# Patient Record
Sex: Female | Born: 1970 | Race: White | Hispanic: No | Marital: Married
Health system: Southern US, Community
[De-identification: ages and names within clinical notes are randomized; demographics above are authoritative.]

## PROBLEM LIST (undated history)

## (undated) DIAGNOSIS — K5010 Crohn's disease of large intestine without complications: Secondary | ICD-10-CM

## (undated) HISTORY — DX: Crohn's disease of large intestine without complications: K50.10

---

## 1999-07-03 ENCOUNTER — Other Ambulatory Visit: Admission: RE | Admit: 1999-07-03 | Discharge: 1999-07-03 | Payer: Self-pay | Admitting: Gynecology

## 2000-08-02 ENCOUNTER — Other Ambulatory Visit: Admission: RE | Admit: 2000-08-02 | Discharge: 2000-08-02 | Payer: Self-pay | Admitting: Gynecology

## 2001-06-22 ENCOUNTER — Encounter: Payer: Self-pay | Admitting: Emergency Medicine

## 2001-06-22 ENCOUNTER — Inpatient Hospital Stay (HOSPITAL_COMMUNITY): Admission: EM | Admit: 2001-06-22 | Discharge: 2001-06-24 | Payer: Self-pay | Admitting: Emergency Medicine

## 2001-06-23 ENCOUNTER — Encounter: Payer: Self-pay | Admitting: Geriatric Medicine

## 2001-08-02 ENCOUNTER — Other Ambulatory Visit: Admission: RE | Admit: 2001-08-02 | Discharge: 2001-08-02 | Payer: Self-pay | Admitting: Gynecology

## 2001-08-11 ENCOUNTER — Encounter (INDEPENDENT_AMBULATORY_CARE_PROVIDER_SITE_OTHER): Payer: Self-pay | Admitting: Specialist

## 2001-08-11 ENCOUNTER — Ambulatory Visit (HOSPITAL_COMMUNITY): Admission: RE | Admit: 2001-08-11 | Discharge: 2001-08-11 | Payer: Self-pay | Admitting: Gastroenterology

## 2001-09-21 ENCOUNTER — Encounter (INDEPENDENT_AMBULATORY_CARE_PROVIDER_SITE_OTHER): Payer: Self-pay | Admitting: Specialist

## 2001-09-21 ENCOUNTER — Inpatient Hospital Stay (HOSPITAL_COMMUNITY): Admission: RE | Admit: 2001-09-21 | Discharge: 2001-09-22 | Payer: Self-pay | Admitting: Gynecology

## 2002-09-14 ENCOUNTER — Other Ambulatory Visit: Admission: RE | Admit: 2002-09-14 | Discharge: 2002-09-14 | Payer: Self-pay | Admitting: Gynecology

## 2003-11-17 ENCOUNTER — Emergency Department (HOSPITAL_COMMUNITY): Admission: EM | Admit: 2003-11-17 | Discharge: 2003-11-17 | Payer: Self-pay | Admitting: Emergency Medicine

## 2003-11-20 ENCOUNTER — Other Ambulatory Visit: Admission: RE | Admit: 2003-11-20 | Discharge: 2003-11-20 | Payer: Self-pay | Admitting: Gynecology

## 2003-11-22 ENCOUNTER — Ambulatory Visit (HOSPITAL_COMMUNITY): Admission: RE | Admit: 2003-11-22 | Discharge: 2003-11-23 | Payer: Self-pay | Admitting: General Surgery

## 2003-11-22 ENCOUNTER — Encounter (INDEPENDENT_AMBULATORY_CARE_PROVIDER_SITE_OTHER): Payer: Self-pay | Admitting: Specialist

## 2004-11-20 ENCOUNTER — Other Ambulatory Visit: Admission: RE | Admit: 2004-11-20 | Discharge: 2004-11-20 | Payer: Self-pay | Admitting: Gynecology

## 2006-01-13 ENCOUNTER — Other Ambulatory Visit: Admission: RE | Admit: 2006-01-13 | Discharge: 2006-01-13 | Payer: Self-pay | Admitting: Gynecology

## 2007-05-05 ENCOUNTER — Other Ambulatory Visit: Admission: RE | Admit: 2007-05-05 | Discharge: 2007-05-05 | Payer: Self-pay | Admitting: Gynecology

## 2007-05-13 ENCOUNTER — Encounter: Admission: RE | Admit: 2007-05-13 | Discharge: 2007-05-13 | Payer: Self-pay | Admitting: Gynecology

## 2008-05-25 ENCOUNTER — Emergency Department (HOSPITAL_COMMUNITY): Admission: EM | Admit: 2008-05-25 | Discharge: 2008-05-25 | Payer: Self-pay | Admitting: Emergency Medicine

## 2008-06-07 ENCOUNTER — Other Ambulatory Visit: Admission: RE | Admit: 2008-06-07 | Discharge: 2008-06-07 | Payer: Self-pay | Admitting: Gynecology

## 2008-07-27 ENCOUNTER — Inpatient Hospital Stay (HOSPITAL_COMMUNITY): Admission: EM | Admit: 2008-07-27 | Discharge: 2008-07-31 | Payer: Self-pay | Admitting: Emergency Medicine

## 2009-07-26 ENCOUNTER — Encounter: Admission: RE | Admit: 2009-07-26 | Discharge: 2009-07-26 | Payer: Self-pay | Admitting: Gynecology

## 2009-09-12 ENCOUNTER — Emergency Department (HOSPITAL_COMMUNITY): Admission: EM | Admit: 2009-09-12 | Discharge: 2009-09-12 | Payer: Self-pay | Admitting: Emergency Medicine

## 2009-09-28 IMAGING — CT CT ABDOMEN W/ CM
2 of 5 series · 16 of 46 positions shown, 18 images · IV contrast (agent unspecified)
Comparison: CT 05/13/2007

CT ABDOMEN

CLINICAL DATA: Epigastric pain, Crohn disease, prior ileal cecal
resection.

CT ABDOMEN AND PELVIS WITH CONTRAST
TECHNIQUE: Multidetector CT imaging of the abdomen and pelvis was
performed using the standard protocol following bolus
administration of intravenous contrast.
Contrast: 100 ml  Omni 300

[Series 2: abd_pel 5.0 b40f st · axial · 0.58mm/px · z∈[-590,-210]mm · 13 of 86 slices shown, 15 images]
[im 5/86  soft-tissue]
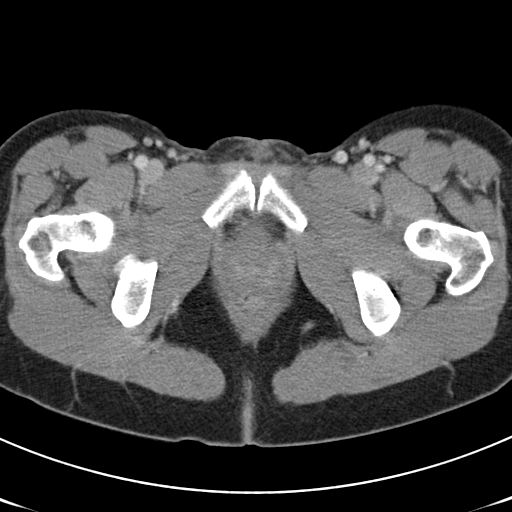
[im 5/86  bone]
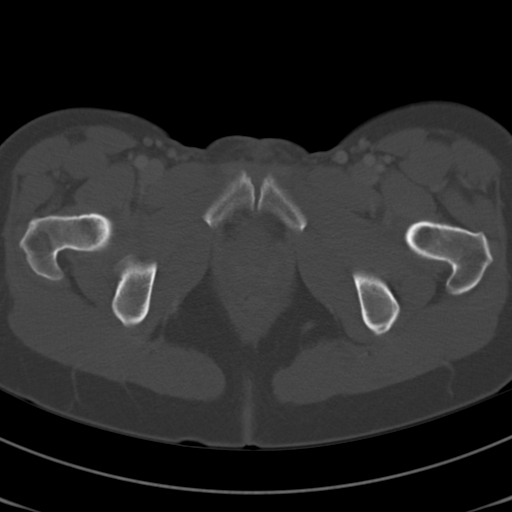
[im 14/86  soft-tissue]
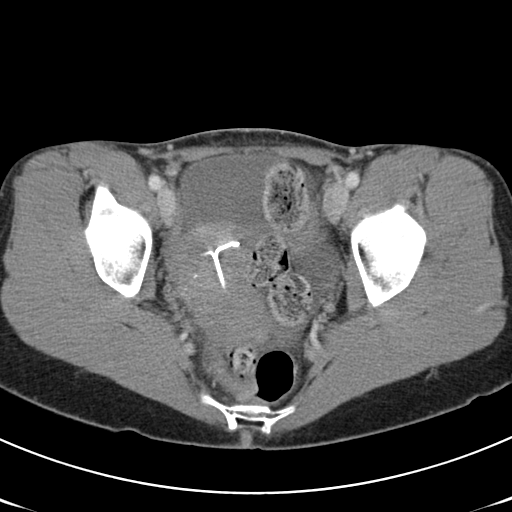
[im 18/86  soft-tissue]
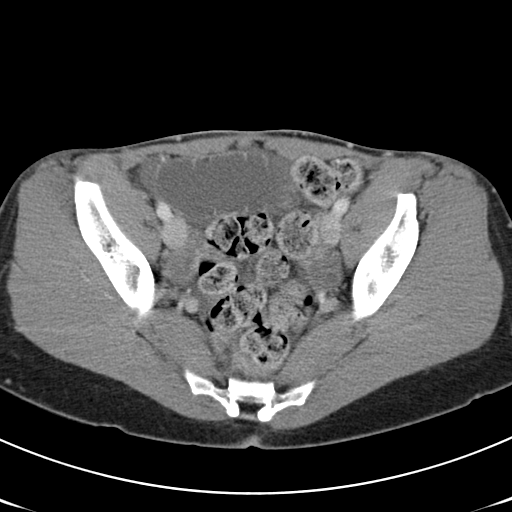
[im 23/86  soft-tissue]
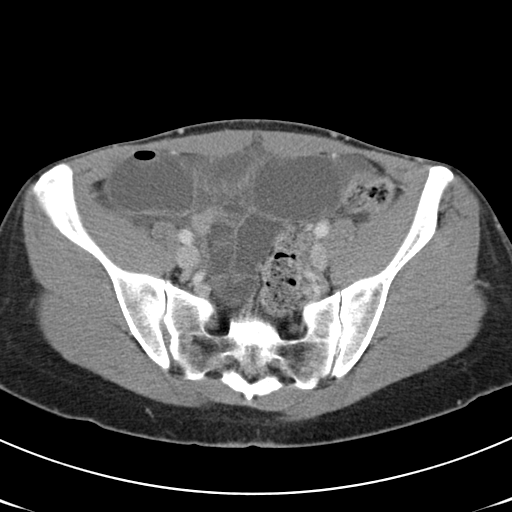
[im 32/86  soft-tissue]
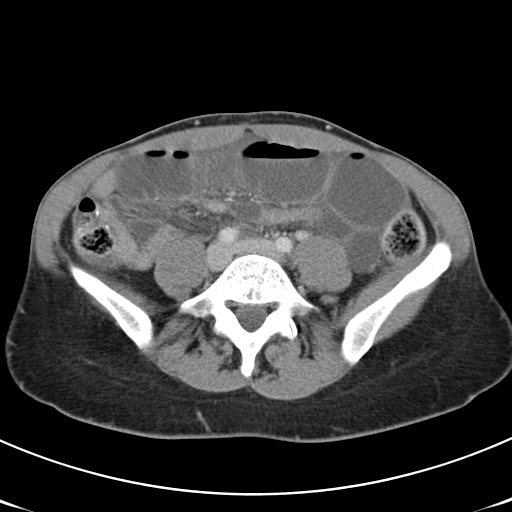
[im 36/86  soft-tissue]
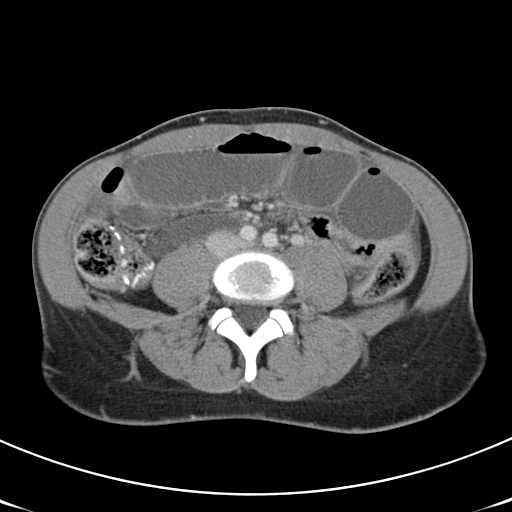
[im 45/86  soft-tissue]
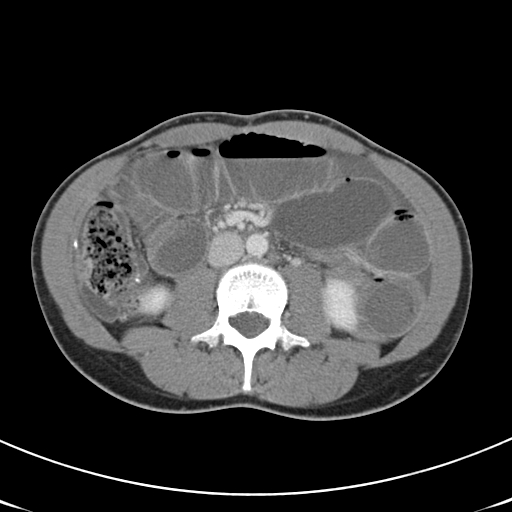
[im 50/86  soft-tissue]
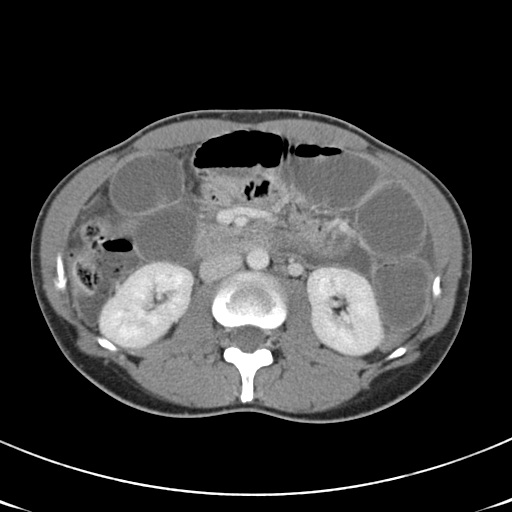
[im 54/86  soft-tissue]
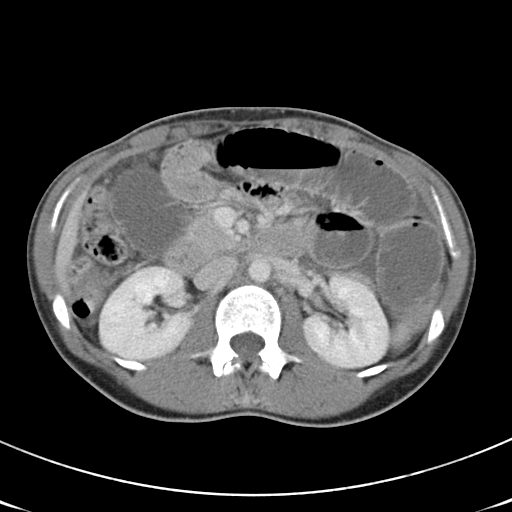
[im 54/86  bone]
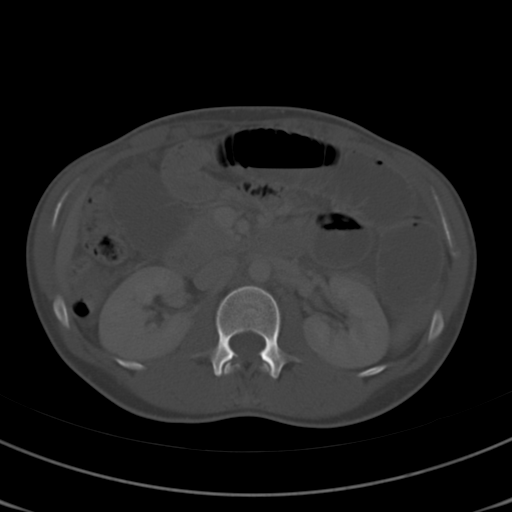
[im 63/86  soft-tissue]
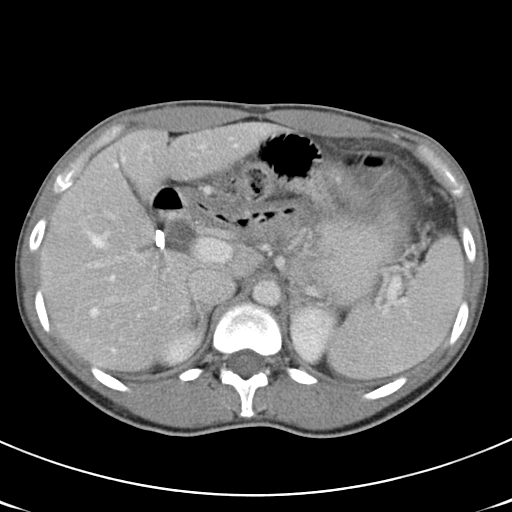
[im 68/86  soft-tissue]
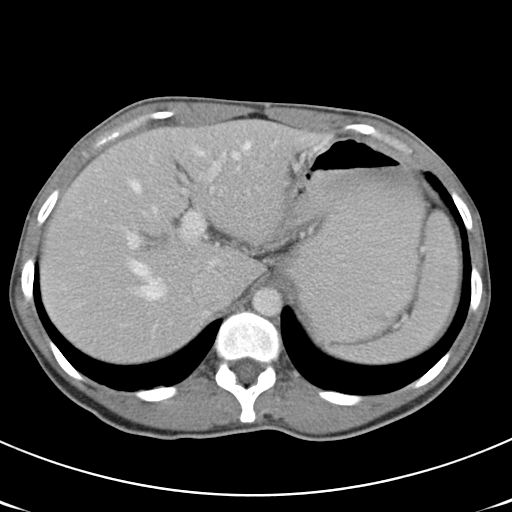
[im 72/86  soft-tissue]
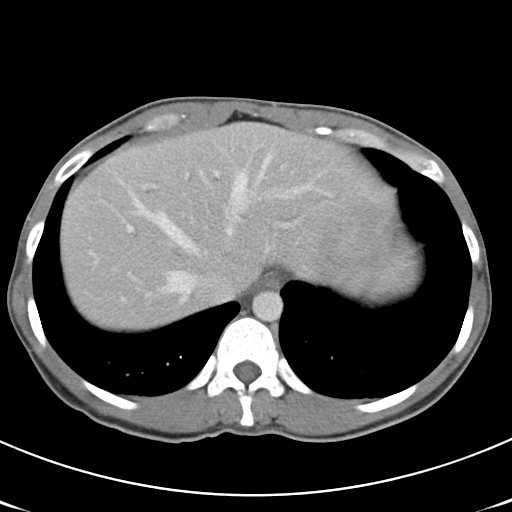
[im 81/86  soft-tissue]
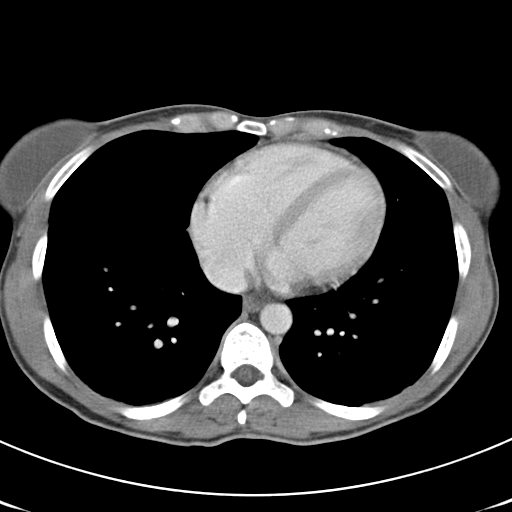

[Series 602: <mpr thick range> · coronal · 0.87mm/px · 3 of 61 slices shown]
[im 21/61  soft-tissue]
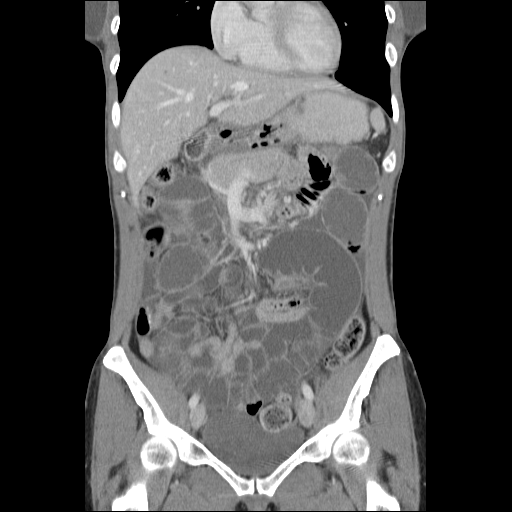
[im 27/61  soft-tissue]
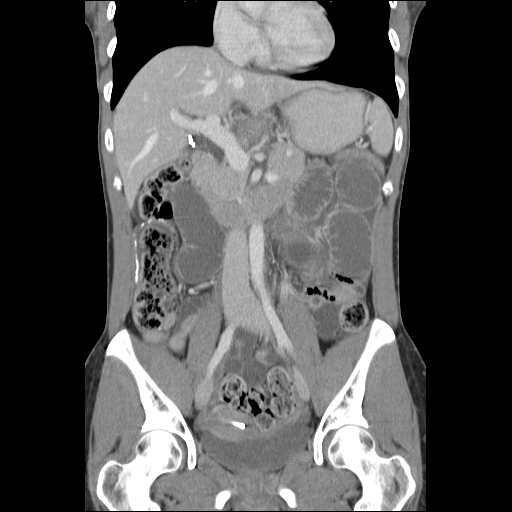
[im 34/61  soft-tissue]
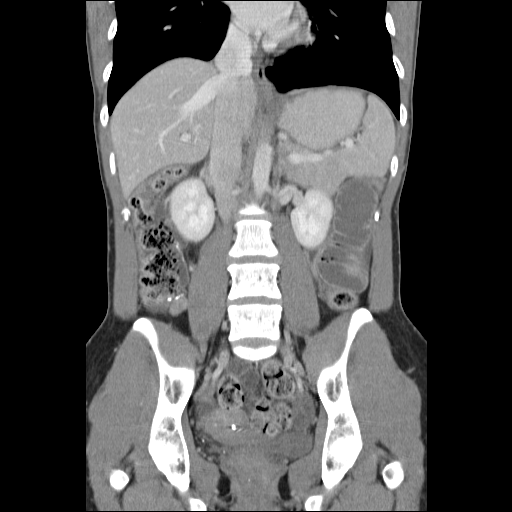

[16 of 46 positions shown; findings below may reference images not displayed]

FINDINGS: Bilateral breast implants.  Lung bases are clear.  Heart
is normal.

Likely hemangioma within the superior right hepatic lobe measures
12 mm unchanged from prior.  There is intrahepatic ductal
dilatation which appears slightly increased from prior.    The
pancreas appears normal without evidence of ductal dilatation.

The spleen, adrenal glands, and kidneys appear normal.  This
nonobstructing calculus in the lower pole of the left kidney which
is unchanged from prior.

The a gastric antrum demonstrates some mural thickening.  The
duodenum appears normal.  The proximal jejunum and ileum are
uniformly dilated and fluid-filled measuring up to 3.7 cm.
Findings consistent with obstruction.  Dilated loops extending to
the right lower quadrant.  The more distal loops towards the
terminal ileum are not as dilated as the proximal lobes.  No clear
transition point identified.  There is surgical anastomoses in the
right lower quadrant with an enteric colonic anastomoses.  There is
stool in the ascending colon.  The descending colon appears
collapsed.  There is stool in the rectum and sigmoid colon.

Abdominal aorta is normal caliber.  No evidence of lymphadenopathy.

No free fluid in the abdomen or free air.
IMPRESSION: 1..  Findings consistent with small bowel obstruction which appears
to likely be in the right lower quadrant with dilated loops of
ileum and jejunum.  This may represent a high-grade partial
obstruction as there is stool within the ascending colon sigmoid
colon and rectum.

2..  Intrahepatic biliary ductal dilatation appears slightly more
prominent than prior.  Recommend correlation with LFTs.

3.  Thickening in the antral region of the stomach.  This could
represent Crohn disease of the stomach.

CT PELVIS
FINDINGS: No free fluid in the pelvis.  There is stool in the
rectum and sigmoid colon.  IUD within the uterus.  The bladder
appears normal.  The adnexa appear normal.

Review of bone windows demonstrates no aggressive osseous lesions.
IMPRESSION: 1.  No evidence of acute pelvic process.

Findings conveyed to Dr. Benrabah   07/27/2008 at [DATE]

## 2009-10-09 ENCOUNTER — Ambulatory Visit (HOSPITAL_COMMUNITY): Admission: RE | Admit: 2009-10-09 | Discharge: 2009-10-09 | Payer: Self-pay | Admitting: Surgery

## 2010-02-04 ENCOUNTER — Encounter: Admission: RE | Admit: 2010-02-04 | Discharge: 2010-02-04 | Payer: Self-pay | Admitting: Gastroenterology

## 2010-02-06 ENCOUNTER — Ambulatory Visit (HOSPITAL_COMMUNITY): Admission: RE | Admit: 2010-02-06 | Discharge: 2010-02-06 | Payer: Self-pay | Admitting: Gastroenterology

## 2010-09-05 ENCOUNTER — Encounter: Admission: RE | Admit: 2010-09-05 | Discharge: 2010-09-05 | Payer: Self-pay | Admitting: Gynecology

## 2011-02-26 LAB — COMPREHENSIVE METABOLIC PANEL
ALT: 16 U/L (ref 0–35)
Albumin: 4.1 g/dL (ref 3.5–5.2)
Alkaline Phosphatase: 38 U/L — ABNORMAL LOW (ref 39–117)
CO2: 26 mEq/L (ref 19–32)
Creatinine, Ser: 0.73 mg/dL (ref 0.4–1.2)
GFR calc Af Amer: >60 mL/min (ref >60)
GFR calc non Af Amer: >60 mL/min (ref >60)
Sodium: 136 mEq/L (ref 135–145)
Total Protein: 7 g/dL (ref 6.0–8.3)

## 2011-02-26 LAB — URINALYSIS, ROUTINE W REFLEX MICROSCOPIC
Glucose, UA: NEGATIVE mg/dL
Protein, ur: NEGATIVE mg/dL
Specific Gravity, Urine: 1.025 (ref 1.005–1.030)

## 2011-02-26 LAB — LIPASE, BLOOD: Lipase: 28 U/L (ref 11–59)

## 2011-02-26 LAB — DIFFERENTIAL
Basophils Relative: 0 % (ref 0–1)
Eosinophils Relative: 0 % (ref 0–5)
Lymphocytes Relative: 9 % — ABNORMAL LOW (ref 12–46)
Monocytes Absolute: 0.1 10*3/uL (ref 0.1–1.0)
Monocytes Relative: 3 % (ref 3–12)

## 2011-02-26 LAB — CBC
Hemoglobin: 12 g/dL (ref 12.0–15.0)
MCHC: 34 g/dL (ref 30.0–36.0)
MCV: 93 fL (ref 78.0–100.0)
Platelets: 160 10*3/uL (ref 150–400)
RBC: 3.79 MIL/uL — ABNORMAL LOW (ref 3.87–5.11)
RDW: 13 % (ref 11.5–15.5)
WBC: 5.1 10*3/uL (ref 4.0–10.5)

## 2011-02-26 LAB — POCT PREGNANCY, URINE: Preg Test, Ur: NEGATIVE

## 2011-04-07 NOTE — H&P (Signed)
Kerri Norton, Kerri Norton                ACCOUNT NO.:  [id number]   MEDICAL RECORD NO.:  [id number]          PATIENT TYPE:  EMS   LOCATION:  ED                           FACILITY:  [facility]   PHYSICIAN:  Michiel Cowboy, MDDATE OF BIRTH:  June 23, 1971   DATE OF ADMISSION:  07/27/2008  DATE OF DISCHARGE:                              HISTORY & PHYSICAL   PRIMARY CARE Donnel Venuto:  Dr. Abigail Miyamoto.   CHIEF COMPLAINT:  Abdominal pain, nausea and vomiting.  The patient is a  40 year old female with a history of Crohn disease and history of small  bowel obstruction in the past who presents with sudden onset of  abdominal pain, nausea and vomiting and then feeling somewhat  lightheaded and diaphoretic.  She presented to the emergency department  where she was found to have small bowel obstruction.  The patient had a  NG tube placed and currently feeling better.  She reports improvement of  her Crohn's after she had a partial bowel resection some years back.  She is followed by [facility] for her Crohn's.   REVIEW OF SYSTEMS:  No fevers, no chills.  Positive for nausea and  vomiting.  No constipation, had a last bowel movement yesterday and  unsure when was the last, but she is passing gas.  Otherwise  unremarkable except for HPI.   PAST MEDICAL HISTORY:  Significant for:  1. Crohn's.  2. Also a history of palpitations followed by Dr. Abigail Miyamoto.   SOCIAL HISTORY:  The patient never smoked, drank or used drugs.  Lives  at home with her husband, has 2 children, very active, does a lot of  exercises.   MEDICATIONS:  Currently none.  She used to take Pentasa years ago but  stopped after her pregnancy.   ALLERGIES:  SULFA.   VITALS:  Temperature 98.2.  Blood pressure ranging between 80/59 to  107/63.  Per patient, it has always been in 90s/50s, Pulse 85.  Respirations 20.  Satting 97% on room air.  The patient appears to be in  no acute distress.  HEAD:  Nontraumatic.  NG tube in place with bilious  fluid in the tube.  NECK:  Supple.  No lymphadenopathy noted.  HEART:  Regular rate and rhythm.  No murmurs, rubs or gallops.  LUNGS:  Clear to auscultation bilaterally.  No wheezes on rhonchi.  ABDOMEN:  Actually soft, decreased bowel sounds in all 4 quarters.  No  rebound tenderness and no signs of acute abdomen.  LOWER EXTREMITIES:  Without edema.  NEUROLOGIC:  Intact.  Strength 5 out of 5, moving all 4 extremities.  Cranial Nerves II-XII intact.   LABS:  White blood cell count 16, hemoglobin 14.3, sodium 134, potassium  3.5, creatinine 0.81, lipase 26.  CT scan of the abdomen showing  consistent small bowel obstruction that appears to be likely in the  right lower quadrant with dilated loop within the jejunum, probably high-  grade partial obstruction.  Also, intrahepatic biliary duct dilatation,  fecal in the ventral region of the stomach.  Ultrasound of abdomen done  on September 4 showing a gallbladder  surgically absent, common bile duct  within normal caliber, bilateral nephrolithiasis.   ASSESSMENT/PLAN:  This is a 40 year old female with history of Crohn's  now presents with:  1. Small bowel obstruction.  We will make n.p.o., give intravenous ,      continue NG suction, serial KUB and she is already doing better.      Hoped that there will be resolution without needing a surgical      intervention.  We will give Dilaudid for pain and Zofran for      nausea.  2. Crohn's appears to be stable.  Unsure of if current presentation      secondary to Crohn's.  Consider a gastrointestinal consult to see      if patient may need further treatment and evaluation.  3. Slight hyponatremia.  We will give intravenous fluids and watch.  4. Prophylaxis, Lovenox with SCDs.  5. Bilateral nephrolithiasis.  We will send urinalysis to figure if      there is any blood in urine and also obtain renal ultrasound to see      if there is any evidence of obstruction for hydronephrosis.       Michiel Cowboy, MD  Electronically Signed     AVD/MEDQ  D:  07/27/2008  T:  07/27/2008  Job:  [id number]   cc:   Chales Salmon. Abigail Miyamoto, M.D.  Fax: [phone]

## 2011-04-07 NOTE — Consult Note (Signed)
NAMEDESHON, KOSLOWSKI                ACCOUNT NO.:  [id number]   MEDICAL RECORD NO.:  [id number]          PATIENT TYPE:  INP   LOCATION:  1405                         FACILITY:  [facility]   PHYSICIAN:  Almond Lint, MD       DATE OF BIRTH:  03-21-1971   DATE OF CONSULTATION:  DATE OF DISCHARGE:                                 CONSULTATION   CHIEF COMPLAINT:  Small bowel obstruction.   HISTORY OF PRESENT ILLNESS:  Ms. Egler is a 40 year old female with a  history of Crohn's disease who has had an ileocecectomy in the past.  She presents with about 36 hours of abdominal discomfort and had an  episode of severe abdominal pain yesterday with nausea and vomiting, as  well as diaphoresis.  she was seen in the emergency department and  received a CT scan.  This was concerning for a small bowel obstruction,  although no transition point was seen.  She states that overall she has  been doing very well.  She has had not had any problems with her Crohn's  in about 7 years.  She is no longer requiring medication and has normal  bowel movements.  She has had no changes in her appetite and no  progressive weight loss.  She also denies bloody bowel movements.  She  states that she has had an episode similar to this in the past and  received a CT scan and immediately felt better after that.  At this time  she feels much better with her NG tube in place.   The patient does complain of some pain on the left side of her abdomen,  going up around the left side to the epigastrium.   PAST MEDICAL HISTORY:  Significant for palpitations.   SURGICAL HISTORY:  She had an ileocecectomy as well as several GYN  procedures.   SOCIAL HISTORY:  She has not ever smoked or used drugs.  She lives at  home with her husband and 2 children.  She is accompanied by her friend  who is a Surveyor, mining.   ALLERGIES:  SULFA.   MEDICATIONS:  Colace, Lovenox, p.r.n. Dilaudid, Reglan, Zofran, Tylenol.   REVIEW OF  SYSTEMS:  Otherwise negative x 11 systems.   PHYSICAL EXAMINATION:  VITAL SIGNS:  Temperature 98.  Pulse 85.  Blood  pressure 102/59.  Respirations 18.  Sat 100% on room air.  NG tube  canister has approximately 400 mL of bilious material in her room.  She  is alert and oriented x3, in no acute distress.  HEENT:  Normocephalic, atraumatic.  Sclerae are anicteric, and  conjunctivae are not injected.  NECK:  Supple.  No lymphadenopathy.  ABDOMEN:  Soft, nontender and nondistended.  She has no distension, no  rebound or guarding.  EXTREMITIES:  Well-developed, well-perfused with no pitting edema.  Mood  and affect are normal.  She has no gross deformities in her extremities.   LABS:  White count was 16 yesterday.  H and H 14.3 and 42.4.  Coags  essentially normal.  Electrolytes mildly hyponatremic at 134.  Glucose  is slightly elevated at 158, although hemoglobin A1c is normal at 5.6.  UA is positive for ketones, otherwise negative.   CT scan was described above.  Specifically findings consistent with:  1. Small bowel obstruction which appears to be on the right lower      quadrant with dilated loops of duodenum and jejunum.  This was      noted that this may be a high grade partial obstruction.  2. Intrahepatic biliary ductal dilatation.  3. Thickening in the antral region of the stomach.  The patient has      had also an abdominal ultrasound which demonstrates a normal      caliber common bile duct and a surgically absent gallbladder.   ASSESSMENT:  Mrs. Foxworth if a 40 year old female with a history of  Crohn's disease.  This sounds like an adhesive small bowel obstruction  given her lack of progressive symptoms and lack of bloody diarrhea.  Her  Crohn's disease has been stable for many years.  Given the fact that she  is feeling better and is no longer distended with the nasogastric tube,  we will follow her expectantly.  She should remain n.p.o.  We will  evaluate her films in the  morning.  Also we will see if she opens up and  starts passing gas or having bowel movements.  She did have a bowel  movement yesterday, but it is unclear about when she has last passed  gas.  Should she not resolve this spontaneously with a G-tube  decompression she may require surgical therapy.  Now, however, there is  no acute surgical intervention warranted at this time and we will follow  along with Dr. Janee Morn and the primary team.  I do recommend a  gastrointestinal consult given the antral thickening seen on  computerized tomography scan.  I do not feel that any of this episode is  related to a gastric process.  However, this gastric process may be  contributing to her overall health.  She may an ulcer versus gastritis  versus Crohn's in her stomach.  I would recommend [facility]  gastrointestinal consult, as she has seen this group in the past.      Almond Lint, MD  Electronically Signed     FB/MEDQ  D:  07/27/2008  T:  07/28/2008  Job:  [id number]   cc:   Ramiro Harvest, MD

## 2011-04-07 NOTE — Discharge Summary (Signed)
NAMEMARGRETE, Norton                ACCOUNT NO.:  [id number]   MEDICAL RECORD NO.:  [id number]          PATIENT TYPE:  INP   LOCATION:  1405                         FACILITY:  [facility]   PHYSICIAN:  Ramiro Harvest, MD    DATE OF BIRTH:  [birth year]   DATE OF ADMISSION:  07/27/2008  DATE OF DISCHARGE:  07/31/2008                               DISCHARGE SUMMARY   PRIMARY CARE PHYSICIAN:  Chales Salmon. Abigail Miyamoto, M.D. of [facility].   DISCHARGE DIAGNOSES:  1. Partial small-bowel obstruction secondary to adhesions.  2. Hypokalemia.  3. Hyponatremia.  4. Crohn's disease.  5. Bilateral nephrolithiasis.  6. History of palpitations.  7. Status post ileal cecectomy in the past.  8. Status post cholecystectomy per CT scan.  9. Dehydration.   DISCHARGE MEDICATIONS:  None.   DISPOSITION AND FOLLOW UP:  The patient will be discharged home.  The  patient is to follow up with PCP in 2 weeks and follow up basic  metabolic profile needs to be checked to follow up on the patient's  electrolytes and also the patient's bilateral nephrolithiasis if  symptomatic.  The patient is also to call to schedule a follow-up  appointment with a gastroenterologist in 2 weeks for further evaluation  and recommendation of her Crohn's disease, as well as this partial small  bowel obstruction, and the patient can follow up with [facility]  [facility]gery, Dr. Donell Beers, on an as-needed basis.   CONSULTATIONS DONE:  General surgery consult was done.  The patient was  seen in consultation by Dr. Donell Beers on July 27, 2008.   PROCEDURES PERFORMED:  1. An acute abdominal series was performed on July 26, 2008 that      showed no acute cardiopulmonary process, no evidence of free air or      bowel obstruction.  2. An abdominal ultrasound was done on July 27, 2008 which showed      the gallbladder was surgically absent and common bile duct within      normal caliber, bilateral nephrolithiasis.  3. CT of the  abdomen and pelvis was done on July 27, 2008 that      showed findings consistent with a small bowel obstruction which      appears to likely be in the right lower quadrant with dilated loops      of ileum and jejunum, which may represent a high-grade partial      obstruction, as there is stool within the ascending colon, sigmoid      colon and rectum.  Intrahepatic biliary ductal dilatation appears      slightly more prominent than prior.  Recommend correlation with      liver function tests.  Thickening in the antral region of the      stomach.  This could represent Crohn's disease of the stomach.  No      evidence of acute pelvic process.  4. An acute abdominal series was done on July 28, 2008 that showed      distended small bowel loops again identified.  No evidence of      hemoperitoneum.  NG tube within the proximal stomach.  5. On July 29, 2008, an acute abdominal series was done that      showed improvement in abnormal small bowel distention.  No new      findings.  6. An acute abdominal series was done on July 30, 2008 that showed      normalization of bowel gas pattern.   BRIEF ADMISSION HISTORY AND PHYSICAL:  [name] is a 40 year old  female with history of Crohn's disease, history of small bowel  obstruction in the past, who presented with sudden onset of abdominal  pain, nausea, vomiting and feeling somewhat lightheaded and diaphoretic.  She presented to the ED where she was found to have a small bowel  obstruction.  The patient had an NG tube placed and felt better during  the interview.  The patient reported improvement of her Crohn's after  she had a partial bowel resection several years prior, and she is being  followed by [facility] for Crohn's disease.   PHYSICAL EXAMINATION PER ADMITTING PHYSICIAN:  VITAL SIGNS:  Temperature  98.2, blood pressure ranged between 80 to 59 to 107/63 (per the patient,  her blood pressures have  always been in the 90s), pulse of 85,  respiratory rate 20, satting 97% on room air.  GENERAL:  The patient appeared to be in no acute distress.  HEENT:  Normocephalic, atraumatic.  NG tube in place with bilious fluid  in the tube.  NECK:  Supple.  No lymphadenopathy.  CARDIOVASCULAR:  Regular rate and rhythm.  No murmurs, rubs or gallops.  RESPIRATORY:  Lungs are clear to auscultation bilaterally.  No wheezes,  no rhonchi.  ABDOMEN:  Soft.  Decreased bowel sounds in all full quadrants.  No  rebound tenderness and no signs of acute abdomen.  EXTREMITIES:  Lower extremities with no clubbing, cyanosis or edema.  NEUROLOGIC:  The patient was alert and oriented x3.  Cranial nerves II-  XII were grossly intact.  There was 5/5 bilateral upper extremity  strength.  Moving all four extremities spontaneously.   ADMISSION LABORATORIES:  White count 16, hemoglobin 14.3.  Sodium 134,  potassium 3.5, creatinine 0.81, lipase of 26.  CT scan of the abdomen  showing persistent small-bowel obstruction that appears to be likely in  the right lower quadrant with dilated loops within the jejunum, probably  a high-grade partial obstruction, also intrahepatic biliary duct  dilatation.  Feces in the ventral region of the stomach.  Ultrasound of  the abdomen done on July 27, 2008 showing gallbladder surgically  absent, common bile duct within normal caliber, bilateral  nephrolithiasis.   HOSPITAL COURSE:  1. Partial small bowel obstruction secondary to adhesions.  The      patient was admitted, was made n.p.o., placed on IV fluids, NG tube      was placed to suction, and the patient was hydrated with IV fluids,      and conservative measures were undertaken with pain management and      antibiotics, as well as supportive care.  The patient's symptoms      improved slowly on a daily basis.  The patient had started to pass      flatus, but had no bowel movement.  The patient nausea improved.      The  patient was no longer having any abdominal pain.  The patient      had her NG tube clamped and was placed on clear liquids which she  tolerated well.  Serial abdominal films were obtained, and on      July 30, 2008, abdominal film obtained showed resolution of her      partial small bowel obstruction.  General surgery was consulted and      was following the patient throughout the patient's hospitalization.      NG tube was then removed.  The patient was tried on a soft      mechanical diet, which the patient tolerated.  The patient will be      discharged in stable and improved condition to follow up with      gastroenterology, as well as with surgery on an as-needed basis.      The patient was hydrated, and the patient will be discharged in      stable and improved condition.  2. Hypokalemia.  The patient was found to be hypokalemic during the      hospitalization.  The patient's potassium was repleted, and this      will need to be followed up as an outpatient.  3. Hyponatremia.  The patient was slightly hyponatremic on admission.      It was felt this was secondary to volume depletion.  The patient      was hydrated with IV fluids with resolution of her hyponatremia      during the hospitalization.  4. Bilateral nephrolithiasis as seen on CT scan.  An abdominal      ultrasound was obtained which showed no hydronephrosis.  The      patient remained stable and asymptomatic throughout the      hospitalization, and this will need to be followed up as an      outpatient.  5. Dehydration.  The patient was hydrated with IV fluids during the      hospitalization, and the patient became euvolemic prior to      discharge.  The patient will be discharged in stable and improved      condition.   VITAL SIGNS ON DAY OF DISCHARGE:  Temperature 98.7, blood pressure  99/68, pulse of 62, respiratory rate 16, satting 100% on room air.   DISCHARGE LABORATORIES:  Sodium 142, potassium 3.3,  chloride 109,  bicarbonate 30, BUN less than 1, creatinine 0.70, glucose of 112,  calcium of 8.1.  CBC revealed a white count of 3.4, hemoglobin 11.0,  platelets of 117, hematocrit of 31.6.   It has been a pleasure taking care of [name].      Ramiro Harvest, MD  Electronically Signed     DT/MEDQ  D:  07/31/2008  T:  08/01/2008  Job:  [id number]   cc:   Chales Salmon. Abigail Miyamoto, M.D.  Fax: [phone]   Almond Lint, MD  [address]  [place] [address]   Ulyess Mort, MD  [address]  [place]  [place] [address]

## 2011-04-10 NOTE — H&P (Signed)
[facility]  Patient:    Kerri Norton, Kerri Norton Visit Number: [id number] MRN: [id number]          Service Type: GYN Location: 4W [id number] [id number] Attending Physician:  Rolinda Roan Dictated by:   Leatha Gilding. Mezer, M.D. Admit Date:  09/21/2001                           History and Physical  ADMISSION DIAGNOSIS:  Left ovarian dermoid cyst.  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 The potential complications of the surgery including, but not limited to, anesthesia, injury to the bowel, bladder, ureters, infection, and blood loss with transfusion and its sequelae have been reviewed with the patient in detail. Postoperative expectations and restrictions have also been reviewed with the patient.  PAST SURGICAL HISTORY:  Laparoscopy and laparotomy for a dermoid cyst and exploratory laparotomy and bowel resection for probable Crohns disease.  PAST MEDICAL HISTORY:  Probable Crohns disease.  MEDICATIONS:  Pentasa.  ALLERGIES:  None known.  SOCIAL HISTORY:  Smokes:  None. ETOH:  Occasional. The patient is married with a supportive family.  FAMILY HISTORY:  Negative for carcinoma.  PHYSICAL EXAMINATION:  HEENT:  Negative.  LUNGS:  Clear.  CARDIOVASCULAR:  The heart is without murmurs.  ABDOMEN:  Soft and nontender.  PELVIC:  BUS, vagina, and cervix to be normal. The uterus is anterior, normal in size. The adnexa are without palpable masses.  EXTREMITIES:  Negative.  IMPRESSION:  Left ovarian dermoid cyst.  PLAN:  Exploratory laparotomy and ovarian cystectomy. Dictated by:   Vinetta Bergamo, M.D. Attending Physician:  Rolinda Roan DD:  09/21/01 TD:   09/21/01 Job: [id number]

## 2011-04-10 NOTE — H&P (Signed)
[facility]  Patient:    Kerri Norton, Kerri Norton                       MRN: [id number] Adm. Date:  [id number] Attending:  Benny Lennert                         History and Physical  DIAGNOSES: 1. Abdominal pain in patient with Crohns disease, rule out early small bowel    obstruction from adhesions. 2. Mild hypokalemia.  HISTORY OF PRESENT ILLNESS:  Laiba Fuerte is a 40 year old white female with abdominal pain and history of Crohns disease.  She was in her usual state of health until 9 p.m. last evening with the onset of abdominal initially epigastric in location, increasing in severity and constant.  Aurore came to the emergency room with nausea and vomiting.  She was treated with Phenergan and Demerol but not better, and the emergency room physician recommended the patient be admitted.  PAST MEDICAL HISTORY/HOSPITALIZATIONS/OPERATIONS.  Partial small bowel and large bowel resection 1993 by Dr. Luretha Murphy for her Crohns cyst. History of right dermoid cyst removal.  Normal vaginal delivery twice in the past.  History of Crohns disease.  History of kidney stone one time in 1997.  CURRENT MEDICATIONS:  None.  ALLERGIES:  Kerri Norton known drug allergies.  SOCIAL HISTORY:  Nonsmoker.  Kerri Norton alcohol use.  She is married.  Husband accompanies her to the emergency room.  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  FAMILY HISTORY:  Mother and father are alive and well.  REVIEW OF SYSTEMS:  Kerri Norton history of gallstones.  Kerri Norton history of heart, lung or liver disease.  PHYSICAL EXAMINATION:  VITAL SIGNS:  Blood pressure 87/57, pulse 87, respiratory rate 20, temperature 98.7.  GENERAL:  He is alert and oriented x 3.  HEENT:  Normocephalic, atraumatic.  EOMI, PERRLA, nonicteric.  Posterior pharynx clear.  RESPIRATORY:  Exam is clear.  HEART:  Regular rate and rhythm, normal S1, S2 with grade 2/6 systolic ejection murmur at the left sternal border without  radiation.  ABDOMEN:  Tender right and left lower quadrants with rebound.  Decreased bowel sounds.  Kerri Norton hepatosplenomegaly.  RECTAL:  Stool brown, guaiac negative.  EXTREMITIES:  Without cyanosis, clubbing or edema.  NEUROLOGICAL:  Intact.  LABORATORY DATA:  Urinalysis negative except for trace leukocyte esterase. Urine pregnancy test is negative.  Lipase 20, amylase 100. Liver function is normal.  Potassium is slightly low at 3.4  White count 9.0, hemoglobin 12.0.  Acute abdominal series shows some mild early air-fluid levels, possible early small bowel obstruction with current ileus.  ASSESSMENT AND PLAN: 1. Abdominal pain with probable Crohns disease flare-up, possibly adhesions    with ileus and early small bowel obstruction.  Follow with serial    abdominal x-rays.  Sedimentation rate pending.  IV fluids given.  Will    need consult with general surgeon.  She has seen Dr. Wenda Low in the    past. 2. Hypokalemia.  Add potassium to IV fluids. DD:  06/22/01 TD:  06/22/01 Job: [id number]

## 2011-04-10 NOTE — Discharge Summary (Signed)
[facility]  Patient:    Kerri Norton, Kerri Norton                       MRN: [id number] Adm. Date:  [id number] Disc. Date: 06/24/01 Attending:  Benny Lennert CC:         Chales Salmon. Abigail Miyamoto, M.D.  Ulyess Mort, M.D. [facility]  [place]. Daphine Deutscher, M.D.   Discharge Summary  DATE OF BIRTH:  06-29-1971  DISCHARGE DIAGNOSES: 1. Partial small-bowel obstruction.    a. Resolved with bowel rest.    b. Computed tomography scan:  No definite inflammation to suggest active       Crohns disease. 2. Dehydration. 3. Hypokalemia. 4. Anemia.    a. Discharge hemoglobin 10.7, mean corpuscular volume 89 (admission       hemoglobin 12.0).    b. No evidence of bleeding. 5. Questionable left dermoid cyst seen on computed tomography scan.    a. History of right dermoid cyst. 6. History of Crohns disease.    a. Partial small- and large-bowel obstruction in 1993.    b. Has not had a recurrence or been on medication since approximately       1994. 7. History of kidney stone in 1997. 8. No known drug allergies.  DISCHARGE MEDICATIONS:  None.  CONSULTATIONS:  None.  PROCEDURES: 1. Acute abdominal series (June 22, 2001):  No acute cardiopulmonary disease.    Mild prominence of small-bowel loops in the mid left abdomen.  No definite    evidence of obstruction at this time. 2. Abdominopelvic CT (June 22, 2001):  Mildly dilated loops of small bowel.    Predominantly fluid filled, although there are a few short air fluid    levels seen.  There is reflux of the terminal ileum with the rectal    contrast.  The terminal ileum appears decompressed.  No definite    inflammatory process is seen in the abdomen to suggest active Crohns    disease.  However, it is difficult to completely exclude it without oral    contrast to better visualize the bowel wall.  The pelvis reveals a left    ovarian dermoid measuring 3 x 2.5 cm.  There is a small amount of free    fluid in the  pelvis. 3. Abdominal x-ray (June 23, 2001):  No evidence of small-bowel obstruction.  HOSPITAL COURSE: #1 - PARTIAL SMALL-BOWEL OBSTRUCTION:  Kerri Norton is a 40 year old Caucasian female who presents with an eight-hour history of abdominal pain and cramping, followed by nausea and vomiting.  Her history is significant for previous small- and large-bowel resection and Crohns disease.  On initial evaluation she was tender in the right and left lower quadrants, with some mild rebound. CT scan was done which demonstrated fluid-filled loops of small bowel that appeared obstructed.  There was no definite evidence of inflammation to suggest active Crohns disease, but the study was somewhat limited.  An NG tube was not placed, and she was simply on bowel rest.  The following day her symptoms had improved, and we began advancing her diet.  X-rays correlated with improvement of small-bowel dysfunction.  We watched her overnight to make sure that she would tolerate a soft diet, and she did.  I suspect that this was due to adhesions related to previous surgery.  Ideally, I would like to obtain a small-bowel follow-through in the near future, but the patient informs me that she has extreme difficulty with contrast  and vomits it readily.  They have even tried putting an NG tube in her in the past to get the contrast down.  I will have Dr. Abigail Miyamoto see her in approximately one week and consider a referral to Dr. Victorino Dike.  Of note, the patient is planning a trip out of town tomorrow.  I felt it was reasonable for her to go.  I will make sure that she has a copy of this discharge summary, as well as a copy of her x-ray reports in case anything should happen and she would have to be seen in an emergency setting outside of [place].  #2 - LEFT DERMOID CYST:  CT scan demonstrated an area that looked like a possible dermoid cyst.  Kerri Norton has had one removed from her right ovary in the past and is  followed by a gynecologist here in town.  Alfredo works at [employer] and will be able to set up a vaginal ultrasound and will follow up with her gynecologist.  #3 - ANEMIA:  Admission hemoglobin was normal at 12.0.  After hydration, it dropped to 10.7, with an MCV of 89.  I suspect this may have been reactionary secondary to the acute illness.  There was no evidence of bleeding.  Recommend repeat and follow-up.  #4 - DEHYDRATION:  Kerri Norton was not profoundly dehydrated because her symptoms had begun only approximately 8-10 hours prior to presentation.  She was noted to have moderately low blood pressures, but this is normal for the patient.  She was ambulating without difficulty.  LABORATORY DATA:  Admission amylase and lipase normal.  Admission liver function tests normal.  Sedimentation rate 5.  Discharge hemoglobin 10.7, MCV 89, WBC 5800, platelet count 154.  Electrolytes normal.  Urine pregnancy negative. DD:  06/24/01 TD:  06/24/01 Job: [id number]

## 2011-04-10 NOTE — Op Note (Signed)
[facility]  Patient:    ASA, FATH Visit Number: [id number] MRN: [id number]          Service Type: GYN Location: 4W 0457 02 Attending Physician:  Rolinda Roan Dictated by:   Leatha Gilding. Mezer, M.D. Proc. Date: 09/21/01 Admit Date:  09/21/2001   CC:         Harl Bowie, M.D.                           Operative Report  PREOPERATIVE DIAGNOSIS:  Left dermoid cyst.  POSTOPERATIVE DIAGNOSIS:  Left dermoid cyst.  OPERATION PERFORMED:  Exploratory laparotomy and excision of left dermoid cyst.  SURGEON:  Howard C. Mezer, M.D.  ASSISTANT:  Harl Bowie, M.D.  ANESTHESIA:  General endotracheal.  PREPARATION:  Betadine.  DESCRIPTION OF PROCEDURE:  With the patient in the supine position, she was prepped and draped in the routine fashion.  A Pfannenstiel incision was made through an old scar and was carried down to the subcutaneous tissue.  The fascia and peritoneum were opened without difficulty.  Brief exploration of her upper abdomen was benign.  Exploration of the pelvis revealed the uterus to be normal in size.  The right ovary was significantly diminished in size with some scar tissue over it.  The tube was otherwise normal.  These adhesions were not taken down.  The left fallopian tube was normal.  The left ovary contained approximately 4 cm dermoid cyst in the inferior pole.  The ovary was elevated through the incision and packed with moist lap pads.  The capsule of the ovary was opened.  The dermoid cyst very slowly and carefully dissected, and was removed intact.  The remainder of the ovary was explored carefully, and there appeared to be no other dermoid cyst.  The ovary was washed and a small amount of oozing was arrested with cautery.  The ovary was then closed with a Buxton-type closure and 3-0 Vicryl suture.  The pelvis was then washed with copious amounts of warm lactated Ringer solution, and with hemostasis  intact, ________ was made to place the large bowel in the cul-de-sac, and the abdomen was then closed in layers using a running 2-0 Vicryl on the peritoneum, running 0 Vicryl to the midline bilaterally on the fascia, and the skin was closed with staples.  The estimated blood loss was approximately 50 cc.  The sponge, instrument, and needle counts were correct x 2.  The patient tolerated the procedure well and was taken to the recovery room in satisfactory condition. Dictated by:   Leatha Gilding. Mezer, M.D. Attending Physician:  Rolinda Roan DD:  09/21/01 TD:  09/22/01 Job: [id number]

## 2011-04-10 NOTE — Op Note (Signed)
NAMEVIOLANDA, BOBECK                          ACCOUNT NO.:  [id number]   MEDICAL RECORD NO.:  [id number]                   PATIENT TYPE:  OIB   LOCATION:  2550                                 FACILITY:  [facility]   PHYSICIAN:  Jimmye Norman III, M.D.               DATE OF BIRTH:  January 12, 1971   DATE OF PROCEDURE:  11/22/2003  DATE OF DISCHARGE:                                 OPERATIVE REPORT   PREOPERATIVE DIAGNOSIS:  Symptomatic cholelithiasis.   POSTOPERATIVE DIAGNOSIS:  Symptomatic cholelithiasis.   OPERATION PERFORMED:  Laparoscopic cholecystectomy with intraoperative  cholangiogram.   SURGEON:  Marta Lamas. Lindie Spruce, M.D.   ASSISTANT:  Magnus Ivan, RN   ANESTHESIA:  General endotracheal.   ESTIMATED BLOOD LOSS:  20 mL.   COMPLICATIONS:  None.   CONDITION:  Stable.   OPERATIVE FINDINGS:  Normal cholangiogram with very prominent common duct  and junction of the cystic duct to the common duct.   CONDITION:  Stable.   INDICATIONS FOR PROCEDURE:  The patient is a very pleasant 40 year old  female with abdominal pain and gallstones noted on recent ultrasound who now  comes in for a laparoscopic cholecystectomy.   DESCRIPTION OF PROCEDURE:  The patient was taken to the operating room and  placed on the table in the supine position.  After an adequate endotracheal  anesthetic was administered, the patient was prepped and draped in the usual  sterile manner exposing the midline of the abdomen.   The patient had had a previous midline incision for right colectomy done  approximately two years ago.  Because of this, we chose to place a  subxiphoid cannula first using Optiview trocar.  We initially, however, made  a transverse incision measuring approximately 1 cm long to 1.5 cm using a 15  blade in the subxiphoid area and dissected down to the fascia and through  the fascia using the Optiview trocar passing the 11 mm cannula into the  peritoneal cavity just to the right of the  falciform ligament. With it in  place, we were able to pass  the cannula and subsequently visualize the  intraperitoneal contents and as we looked at the lower midline, we could see  that there were no adhesions whatsoever to impede our passage of additional  trocars in the peritoneal cavity.  Along the right flank; however, there  were some adhesions of what remained of the small bowel and right colon to  the lateral wall but this was not in a position where it would alter the  placement of the cannula and we did take down these adhesions just to free  up that part of the colon in case there was symptomatic limitation.  Once we  had the subxiphoid cannula in place, we used the Hasson technique to pass a  Hasson cannula through the supraumbilical incision that was placed  transversely.  With that one in place, two right  costal margin 5 mm cannulas  were placed and with all them in place, we placed the patient in reversed  Trendelenburg position.  Her left side was tilted down and the dissection  begun.  The dome of the robin's egg blue gallbladder was grasped with a  ratcheted grasper through the lateral-most 5 mm cannula and retracted  towards the anterior abdominal wall.  Once it was done this way, we used a  second grasper to retract the infundibulum.  The peritoneum overlying the  hepatoduodenal triangle and triangle of Calot was dissected out exposing the  cystic duct and the cystic artery.  We doubly clipped the cystic artery  proximally and then singly distally and transected it leaving a wide window  for dissecting out the cystic duct.  The cystic duct was then opened using  scissors performing cholecystodochotomy through which a Cook catheter which  had been passed through the anterior abdominal wall was passed.  We flushed  the catheter and then subsequently passed it into the cholecystodochotomy  and secured it in place with an Endoclip.  The subsequent cholangiogram done  with  gas removed from the abdomen showed good flow into the duodenum into  the duct and possible filling.  There was no evidence of stones.  Once the  cholangiogram was done, we placed the patient back into the surgical  position, removed the Endoclip from the cystic duct and Cook catheter and  then removed the cannula.  We then triply clipped the cystic duct distally,  making sure we stayed away from the very prominent common bile duct. We  transected the cystic duct and dissected out the gallbladder from its bed  with minimal difficulty although we did enter into the gallbladder.  We used  an EndoCatch bag to bring it out through the supraumbilical fascial site  which had a pursestring suture in place around the Hasson cannula.  Once we  removed it, we tied down the pursestring suture, leaving off no fascial  defect.  We also closed up the super and subxiphoid fascial defect because  of the finished rotation this was easy to do.  Prior to closing all fascial  sites and using about 2 L of normal saline to irrigate out the peritoneal  cavity because of the bile spillage and there was no evidence of bleeding.  We cauterized the liver adequately.  Once we had all cauterization done and  we had irrigated adequately, we removed all cannulas and aspirated gas and  fluid.  Once this was down, we closed the abdomen, closed the skin using  running subcuticular stitch of 4-0 Vicryl after injecting with 0.25%  Marcaine with epinephrine.  Sterile dressings were applied to all wounds.                                               Kathrin Ruddy, M.D.    JW/MEDQ  D:  11/22/2003  T:  11/22/2003  Job:  [id number]

## 2011-08-13 ENCOUNTER — Other Ambulatory Visit: Payer: Self-pay | Admitting: Endodontics

## 2011-08-26 LAB — CBC
HCT: 31.2 — ABNORMAL LOW
HCT: 31.6 — ABNORMAL LOW
HCT: 42.4
Hemoglobin: 10.4 — ABNORMAL LOW
Hemoglobin: 10.7 — ABNORMAL LOW
Hemoglobin: 11 — ABNORMAL LOW
Hemoglobin: 14.3
MCHC: 34.6
MCHC: 34.7
MCV: 91.6
MCV: 92.5
MCV: 93.5
Platelets: 105 — ABNORMAL LOW
Platelets: 105 — ABNORMAL LOW
RBC: 3.37 — ABNORMAL LOW
RBC: 4.53
RDW: 12.2
RDW: 12.3
WBC: 16 — ABNORMAL HIGH
WBC: 3.4 — ABNORMAL LOW
WBC: 4.2
WBC: 4.8

## 2011-08-26 LAB — BASIC METABOLIC PANEL
BUN: 4 — ABNORMAL LOW
BUN: <1 — ABNORMAL LOW
CO2: 24
CO2: 27
Calcium: 7.4 — ABNORMAL LOW
Calcium: 8 — ABNORMAL LOW
Chloride: 109
Chloride: 110
Creatinine, Ser: 0.65
Creatinine, Ser: 0.85
GFR calc Af Amer: >60
GFR calc Af Amer: >60
GFR calc non Af Amer: >60
GFR calc non Af Amer: >60
GFR calc non Af Amer: >60
Glucose, Bld: 116 — ABNORMAL HIGH
Glucose, Bld: 143 — ABNORMAL HIGH
Potassium: 3.3 — ABNORMAL LOW
Potassium: 3.3 — ABNORMAL LOW
Potassium: 4
Sodium: 141
Sodium: 142

## 2011-08-26 LAB — DIFFERENTIAL
Basophils Absolute: 0
Basophils Absolute: 0
Basophils Relative: 0
Basophils Relative: 0
Basophils Relative: 0
Eosinophils Absolute: 0
Eosinophils Relative: 0
Eosinophils Relative: 1
Lymphocytes Relative: 24
Lymphocytes Relative: 6 — ABNORMAL LOW
Lymphs Abs: 1
Monocytes Absolute: 0.3
Monocytes Absolute: 0.3
Monocytes Relative: 5
Neutro Abs: 14.2 — ABNORMAL HIGH
Neutro Abs: 2.9
Neutrophils Relative %: 72

## 2011-08-26 LAB — COMPREHENSIVE METABOLIC PANEL
Alkaline Phosphatase: 46
BUN: 12
CO2: 29
Chloride: 98
Creatinine, Ser: 0.81
GFR calc non Af Amer: >60
Total Bilirubin: 1.2

## 2011-08-26 LAB — HEMOGLOBIN A1C: Mean Plasma Glucose: 114

## 2011-08-26 LAB — URINE CULTURE: Colony Count: NO GROWTH

## 2011-08-26 LAB — URINALYSIS, ROUTINE W REFLEX MICROSCOPIC
Ketones, ur: 15 — AB
Nitrite: NEGATIVE
pH: 6

## 2011-08-26 LAB — LIPASE, BLOOD: Lipase: 26

## 2011-08-26 LAB — POCT PREGNANCY, URINE: Preg Test, Ur: NEGATIVE

## 2011-08-26 LAB — TSH: TSH: 1.413

## 2011-08-26 LAB — MAGNESIUM: Magnesium: 1.9

## 2012-12-01 ENCOUNTER — Other Ambulatory Visit: Payer: Self-pay | Admitting: Gynecology

## 2012-12-01 DIAGNOSIS — R928 Other abnormal and inconclusive findings on diagnostic imaging of breast: Secondary | ICD-10-CM

## 2012-12-09 ENCOUNTER — Ambulatory Visit
Admission: RE | Admit: 2012-12-09 | Discharge: 2012-12-09 | Disposition: A | Discharge status: Home / Self Care | Payer: 59 | Source: Ambulatory Visit | Attending: Gynecology | Admitting: Gynecology

## 2012-12-09 DIAGNOSIS — R928 Other abnormal and inconclusive findings on diagnostic imaging of breast: Secondary | ICD-10-CM

## 2015-03-19 ENCOUNTER — Other Ambulatory Visit: Payer: Self-pay | Admitting: Gynecology

## 2015-03-22 LAB — CYTOLOGY - PAP

## 2015-06-17 ENCOUNTER — Other Ambulatory Visit: Payer: Self-pay | Admitting: Gastroenterology

## 2015-07-02 ENCOUNTER — Other Ambulatory Visit: Payer: Self-pay | Admitting: Oncology

## 2015-07-02 ENCOUNTER — Telehealth: Payer: Self-pay | Admitting: *Deleted

## 2015-07-02 DIAGNOSIS — D508 Other iron deficiency anemias: Secondary | ICD-10-CM

## 2015-07-02 NOTE — Telephone Encounter (Signed)
This RN called and left a message reminding her of her appointment tomorrow with Dr. Clelia Croft.

## 2015-07-03 ENCOUNTER — Other Ambulatory Visit (HOSPITAL_BASED_OUTPATIENT_CLINIC_OR_DEPARTMENT_OTHER): Payer: BLUE CROSS/BLUE SHIELD

## 2015-07-03 ENCOUNTER — Encounter: Payer: Self-pay | Admitting: Oncology

## 2015-07-03 ENCOUNTER — Encounter (INDEPENDENT_AMBULATORY_CARE_PROVIDER_SITE_OTHER): Payer: Self-pay

## 2015-07-03 ENCOUNTER — Ambulatory Visit (HOSPITAL_BASED_OUTPATIENT_CLINIC_OR_DEPARTMENT_OTHER): Payer: BLUE CROSS/BLUE SHIELD | Admitting: Oncology

## 2015-07-03 ENCOUNTER — Ambulatory Visit: Payer: BLUE CROSS/BLUE SHIELD

## 2015-07-03 VITALS — BP 99/67 | HR 64 | Temp 97.5°F | Resp 18 | Ht 61.0 in | Wt 120.6 lb

## 2015-07-03 DIAGNOSIS — D508 Other iron deficiency anemias: Secondary | ICD-10-CM

## 2015-07-03 DIAGNOSIS — K5090 Crohn's disease, unspecified, without complications: Secondary | ICD-10-CM | POA: Diagnosis not present

## 2015-07-03 DIAGNOSIS — D649 Anemia, unspecified: Secondary | ICD-10-CM | POA: Diagnosis not present

## 2015-07-03 DIAGNOSIS — D696 Thrombocytopenia, unspecified: Secondary | ICD-10-CM | POA: Diagnosis not present

## 2015-07-03 DIAGNOSIS — D72819 Decreased white blood cell count, unspecified: Secondary | ICD-10-CM | POA: Diagnosis not present

## 2015-07-03 LAB — IRON AND TIBC CHCC
%SAT: 47 % (ref 21–57)
IRON: 128 ug/dL (ref 41–142)
TIBC: 273 ug/dL (ref 236–444)
UIBC: 144 ug/dL (ref 120–384)

## 2015-07-03 LAB — CBC WITH DIFFERENTIAL/PLATELET
BASO%: 0.5 % (ref 0.0–2.0)
Basophils Absolute: 0 10*3/uL (ref 0.0–0.1)
EOS%: 1.3 % (ref 0.0–7.0)
Eosinophils Absolute: 0.1 10*3/uL (ref 0.0–0.5)
HEMATOCRIT: 36.4 % (ref 34.8–46.6)
HGB: 12.2 g/dL (ref 11.6–15.9)
LYMPH#: 1 10*3/uL (ref 0.9–3.3)
LYMPH%: 25.3 % (ref 14.0–49.7)
MCH: 30.7 pg (ref 25.1–34.0)
MCHC: 33.5 g/dL (ref 31.5–36.0)
MCV: 91.7 fL (ref 79.5–101.0)
MONO#: 0.3 10*3/uL (ref 0.1–0.9)
MONO%: 8.4 % (ref 0.0–14.0)
NEUT#: 2.4 10*3/uL (ref 1.5–6.5)
NEUT%: 64.5 % (ref 38.4–76.8)
Platelets: 165 10*3/uL (ref 145–400)
RBC: 3.97 10*6/uL (ref 3.70–5.45)
RDW: 12.7 % (ref 11.2–14.5)
WBC: 3.8 10*3/uL — AB (ref 3.9–10.3)

## 2015-07-03 LAB — CHCC SMEAR

## 2015-07-03 LAB — FERRITIN CHCC: FERRITIN: 13 ng/mL (ref 9–269)

## 2015-07-03 NOTE — Progress Notes (Signed)
Please see consult note.  

## 2015-07-03 NOTE — Consult Note (Signed)
Reason for Referral: Mild pancytopenia.   HPI: 44 year old woman native of [place] but have been living in this area for a long period of times. She is a relatively healthy woman but was diagnosed with Crohn's disease in 1993. She had bowel obstruction and required bowel resection at that time. She had occasional mechanical obstruction related to adhesions and required hospitalization at times. She have not been on any treatment for her Crohn's disease and have been managed by diet and exercise. Most recently, she was evaluated by her gynecologist for perimenopausal symptoms including fatigue and irregular menstrual cycles. At that time she had a CBC done which showed a white cell count 3.8, hemoglobin 11.8 and a poor candidate 144. Her white cell count differential was perfectly normal. Her thyroid studies were within normal range. Her chemistries including calcium, albumen, liver function test and electrolytes are within normal range. Her vitamin D was 26 which was slightly low compared to normal over 30. Otherwise she is completely asymptomatic at this time. She does not report any headaches, blurry vision, syncope or seizures. She does not report any fevers, chills, sweats or decline in her energy her performance status. She does not report any chest pain, palpitation, orthopnea or leg edema. She does not report any cough, hemoptysis or wheezing. She does not report any nausea, vomiting, abdominal pain she did report occasional constipation but no diarrhea. She does not report any hematochezia or discoloration of her stools. She does not report any frequency urgency or hesitancy. She does not report any skeletal complaints. Remaining review of systems unremarkable.   Past Medical History  Diagnosis Date  . Crohn's colitis   :  No past surgical history on file.:  No current outpatient prescriptions on file.:  Allergies  Allergen Reactions  . Sulfa Antibiotics Itching and Other (See Comments)   Abdominal pains  :  No family history on file.:  Social History   Social History  . Marital Status: Divorced    Spouse Name: N/A  . Number of Children: N/A  . Years of Education: N/A   Occupational History  . Not on file.   Social History Main Topics  . Smoking status: Not on file  . Smokeless tobacco: Not on file  . Alcohol Use: Not on file  . Drug Use: Not on file  . Sexual Activity: Not on file   Other Topics Concern  . Not on file   Social History Narrative  . No narrative on file  :  Pertinent items are noted in HPI.  Exam: ECOG 0 Blood pressure 99/67, pulse 64, temperature 97.5 F (36.4 C), temperature source Oral, resp. rate 18, height 5' 1"  (1.549 m), weight 120 lb 9.6 oz (54.704 kg), SpO2 100 %. General appearance: alert and cooperative Head: Normocephalic, without obvious abnormality Throat: lips, mucosa, and tongue normal; teeth and gums normal Neck: no adenopathy Back: negative Resp: clear to auscultation bilaterally Chest wall: no tenderness Cardio: regular rate and rhythm, S1, S2 normal, no murmur, click, rub or gallop GI: soft, non-tender; bowel sounds normal; no masses,  no organomegaly Extremities: extremities normal, atraumatic, no cyanosis or edema Skin: Skin color, texture, turgor normal. No rashes or lesions   Recent Labs  07/03/15 1036  WBC 3.8*  HGB 12.2  HCT 36.4  PLT 165      Blood smear review: Did not show any abnormalities. White cells, red cells and platelets appeared within normal range.    Assessment and Plan:   44 year old woman with  the following issues:  1. Leukocytopenia: Her white cell count today is 3.8 with the lower limit of normal is 3.9. Her differential was perfectly normal. Her peripheral smear and did not show any abnormalities. Looking back at previous counts are white cell count fluctuated as high as 16,000 and as low as 3.4. The differential diagnosis was discussed with the patient today. Reactive  findings are most likely etiology. Autoimmune disorder and manifestation of Crohn's disease is very distinct possibility. I see no evidence to suggest myelodysplasia, leukemia, or other hematological conditions. I see no evidence of medication effect or any infections.  From a management standpoint, I see no need for imaging studies or bone marrow biopsy. I have recommended routine CBC check in the future and she elected to have that done with her primary care providers.  2. Thrombocytopenia and anemia: Her counts are completely normalized at this time. Her smear is perfectly normal. Leukopenia previous counts dating back to 2009, she had some fluctuations in these counts related to her Crohn's disease. No intervention is needed at this time.  3. Follow-up: I'll be happy to see her in the future as needed.

## 2015-07-03 NOTE — Progress Notes (Signed)
Checked in new pt with no financial concerns. °

## 2016-06-15 ENCOUNTER — Other Ambulatory Visit: Payer: Self-pay | Admitting: Gynecology

## 2016-06-15 DIAGNOSIS — Z1231 Encounter for screening mammogram for malignant neoplasm of breast: Secondary | ICD-10-CM

## 2016-06-22 ENCOUNTER — Other Ambulatory Visit: Payer: Self-pay | Admitting: Physician Assistant

## 2016-06-22 ENCOUNTER — Ambulatory Visit
Admission: RE | Admit: 2016-06-22 | Discharge: 2016-06-22 | Disposition: A | Discharge status: Home / Self Care | Payer: BLUE CROSS/BLUE SHIELD | Source: Ambulatory Visit | Attending: Physician Assistant | Admitting: Physician Assistant

## 2016-06-22 ENCOUNTER — Ambulatory Visit
Admission: RE | Admit: 2016-06-22 | Discharge: 2016-06-22 | Disposition: A | Discharge status: Home / Self Care | Payer: BLUE CROSS/BLUE SHIELD | Source: Ambulatory Visit | Attending: Gynecology | Admitting: Gynecology

## 2016-06-22 DIAGNOSIS — Z1231 Encounter for screening mammogram for malignant neoplasm of breast: Secondary | ICD-10-CM

## 2017-05-25 ENCOUNTER — Other Ambulatory Visit: Payer: Self-pay | Admitting: Physician Assistant

## 2017-05-25 DIAGNOSIS — Z1231 Encounter for screening mammogram for malignant neoplasm of breast: Secondary | ICD-10-CM

## 2017-06-24 ENCOUNTER — Ambulatory Visit
Admission: RE | Admit: 2017-06-24 | Discharge: 2017-06-24 | Disposition: A | Discharge status: Home / Self Care | Payer: BLUE CROSS/BLUE SHIELD | Source: Ambulatory Visit | Attending: Physician Assistant | Admitting: Physician Assistant

## 2017-06-24 DIAGNOSIS — Z1231 Encounter for screening mammogram for malignant neoplasm of breast: Secondary | ICD-10-CM

## 2018-05-17 ENCOUNTER — Other Ambulatory Visit: Payer: Self-pay | Admitting: Physician Assistant

## 2018-05-17 DIAGNOSIS — Z1231 Encounter for screening mammogram for malignant neoplasm of breast: Secondary | ICD-10-CM

## 2018-06-28 ENCOUNTER — Ambulatory Visit
Admission: RE | Admit: 2018-06-28 | Discharge: 2018-06-28 | Disposition: A | Discharge status: Home / Self Care | Payer: BLUE CROSS/BLUE SHIELD | Source: Ambulatory Visit | Attending: Physician Assistant | Admitting: Physician Assistant

## 2018-06-28 DIAGNOSIS — Z1231 Encounter for screening mammogram for malignant neoplasm of breast: Secondary | ICD-10-CM

## 2018-08-16 ENCOUNTER — Ambulatory Visit
Admission: RE | Admit: 2018-08-16 | Discharge: 2018-08-16 | Disposition: A | Discharge status: Home / Self Care | Payer: BLUE CROSS/BLUE SHIELD | Source: Ambulatory Visit | Attending: Gastroenterology | Admitting: Gastroenterology

## 2018-08-16 ENCOUNTER — Other Ambulatory Visit: Payer: Self-pay | Admitting: Gastroenterology

## 2018-08-16 DIAGNOSIS — R112 Nausea with vomiting, unspecified: Secondary | ICD-10-CM

## 2018-08-16 DIAGNOSIS — R1084 Generalized abdominal pain: Secondary | ICD-10-CM

## 2019-05-22 ENCOUNTER — Other Ambulatory Visit: Payer: Self-pay | Admitting: Internal Medicine

## 2019-05-22 DIAGNOSIS — Z1231 Encounter for screening mammogram for malignant neoplasm of breast: Secondary | ICD-10-CM

## 2019-06-30 ENCOUNTER — Ambulatory Visit
Admission: RE | Admit: 2019-06-30 | Discharge: 2019-06-30 | Disposition: A | Discharge status: Home / Self Care | Payer: BC Managed Care – PPO | Source: Ambulatory Visit | Attending: Internal Medicine | Admitting: Internal Medicine

## 2019-06-30 ENCOUNTER — Other Ambulatory Visit: Payer: Self-pay

## 2019-06-30 DIAGNOSIS — Z1231 Encounter for screening mammogram for malignant neoplasm of breast: Secondary | ICD-10-CM

## 2019-09-15 ENCOUNTER — Other Ambulatory Visit: Payer: Self-pay

## 2019-09-15 DIAGNOSIS — Z20822 Contact with and (suspected) exposure to covid-19: Secondary | ICD-10-CM

## 2019-09-16 LAB — NOVEL CORONAVIRUS, NAA: SARS-CoV-2, NAA: NOT DETECTED

## 2019-11-23 ENCOUNTER — Ambulatory Visit: Payer: BC Managed Care – PPO | Attending: Internal Medicine

## 2019-11-23 DIAGNOSIS — Z20822 Contact with and (suspected) exposure to covid-19: Secondary | ICD-10-CM

## 2019-11-24 LAB — NOVEL CORONAVIRUS, NAA: SARS-CoV-2, NAA: NOT DETECTED

## 2019-11-27 ENCOUNTER — Other Ambulatory Visit: Payer: BC Managed Care – PPO

## 2020-06-07 ENCOUNTER — Other Ambulatory Visit: Payer: Self-pay | Admitting: Internal Medicine

## 2020-06-07 DIAGNOSIS — Z1231 Encounter for screening mammogram for malignant neoplasm of breast: Secondary | ICD-10-CM

## 2020-07-09 ENCOUNTER — Other Ambulatory Visit: Payer: Self-pay

## 2020-07-09 ENCOUNTER — Ambulatory Visit
Admission: RE | Admit: 2020-07-09 | Discharge: 2020-07-09 | Disposition: A | Discharge status: Home / Self Care | Payer: BC Managed Care – PPO | Source: Ambulatory Visit | Attending: Internal Medicine | Admitting: Internal Medicine

## 2020-07-09 DIAGNOSIS — Z1231 Encounter for screening mammogram for malignant neoplasm of breast: Secondary | ICD-10-CM

## 2021-08-22 ENCOUNTER — Other Ambulatory Visit: Payer: Self-pay | Admitting: Physician Assistant

## 2021-08-22 DIAGNOSIS — Z1231 Encounter for screening mammogram for malignant neoplasm of breast: Secondary | ICD-10-CM
